# Patient Record
Sex: Male | Born: 1975 | Race: White | Hispanic: No | Marital: Single | Smoking: Current every day smoker
Health system: Southern US, Community
[De-identification: ages and names within clinical notes are randomized; demographics above are authoritative.]

## PROBLEM LIST (undated history)

## (undated) DIAGNOSIS — E785 Hyperlipidemia, unspecified: Secondary | ICD-10-CM

## (undated) DIAGNOSIS — I10 Essential (primary) hypertension: Secondary | ICD-10-CM

## (undated) DIAGNOSIS — N200 Calculus of kidney: Secondary | ICD-10-CM

## (undated) HISTORY — PX: JOINT REPLACEMENT: SHX530

## (undated) HISTORY — PX: ANKLE SURGERY: SHX546

---

## 2005-12-03 ENCOUNTER — Emergency Department: Payer: Self-pay | Admitting: Unknown Physician Specialty

## 2008-01-30 ENCOUNTER — Emergency Department: Payer: Self-pay | Admitting: Emergency Medicine

## 2008-08-11 ENCOUNTER — Emergency Department: Payer: Self-pay | Admitting: Emergency Medicine

## 2008-08-19 ENCOUNTER — Emergency Department: Payer: Self-pay | Admitting: Unknown Physician Specialty

## 2010-12-02 ENCOUNTER — Emergency Department: Payer: Self-pay | Admitting: Emergency Medicine

## 2013-07-28 ENCOUNTER — Emergency Department: Payer: Self-pay | Admitting: Emergency Medicine

## 2013-07-28 LAB — DIFFERENTIAL
BASOS ABS: 0 10*3/uL (ref 0.0–0.1)
Basophil %: 0.5 %
EOS ABS: 0.3 10*3/uL (ref 0.0–0.7)
EOS PCT: 4.3 %
LYMPHS PCT: 18.8 %
Lymphocyte #: 1.5 10*3/uL (ref 1.0–3.6)
Monocyte #: 0.9 x10 3/mm (ref 0.2–1.0)
Monocyte %: 11.1 %
Neutrophil #: 5.2 10*3/uL (ref 1.4–6.5)
Neutrophil %: 65.3 %

## 2013-07-28 LAB — HEPATIC FUNCTION PANEL A (ARMC)
ALBUMIN: 4 g/dL (ref 3.4–5.0)
Alkaline Phosphatase: 79 U/L
BILIRUBIN TOTAL: 0.3 mg/dL (ref 0.2–1.0)
Bilirubin, Direct: <0.1 mg/dL (ref 0.00–0.20)
SGOT(AST): 27 U/L (ref 15–37)
SGPT (ALT): 51 U/L (ref 12–78)
Total Protein: 7.7 g/dL (ref 6.4–8.2)

## 2013-07-28 LAB — URINALYSIS, COMPLETE
BILIRUBIN, UR: NEGATIVE
Blood: NEGATIVE
Glucose,UR: NEGATIVE mg/dL (ref 0–75)
Ketone: NEGATIVE
Leukocyte Esterase: NEGATIVE
NITRITE: NEGATIVE
PROTEIN: NEGATIVE
Ph: 6 (ref 4.5–8.0)
RBC,UR: 2 /HPF (ref 0–5)
SQUAMOUS EPITHELIAL: NONE SEEN
Specific Gravity: 1.015 (ref 1.003–1.030)

## 2013-07-28 LAB — CBC
HCT: 40.7 % (ref 40.0–52.0)
HGB: 13.8 g/dL (ref 13.0–18.0)
MCH: 31.1 pg (ref 26.0–34.0)
MCHC: 34 g/dL (ref 32.0–36.0)
MCV: 92 fL (ref 80–100)
Platelet: 267 10*3/uL (ref 150–440)
RBC: 4.45 10*6/uL (ref 4.40–5.90)
RDW: 13 % (ref 11.5–14.5)
WBC: 8 10*3/uL (ref 3.8–10.6)

## 2013-07-28 LAB — BASIC METABOLIC PANEL
Anion Gap: 3 — ABNORMAL LOW (ref 7–16)
BUN: 18 mg/dL (ref 7–18)
CHLORIDE: 105 mmol/L (ref 98–107)
CREATININE: 0.83 mg/dL (ref 0.60–1.30)
Calcium, Total: 8.8 mg/dL (ref 8.5–10.1)
Co2: 33 mmol/L — ABNORMAL HIGH (ref 21–32)
EGFR (African American): >60
EGFR (Non-African Amer.): >60
GLUCOSE: 100 mg/dL — AB (ref 65–99)
OSMOLALITY: 283 (ref 275–301)
POTASSIUM: 4 mmol/L (ref 3.5–5.1)
SODIUM: 141 mmol/L (ref 136–145)

## 2013-07-28 LAB — TROPONIN I: Troponin-I: <0.02 ng/mL

## 2013-07-28 LAB — SEDIMENTATION RATE: ERYTHROCYTE SED RATE: 3 mm/h (ref 0–15)

## 2015-08-10 ENCOUNTER — Emergency Department: Payer: Self-pay

## 2015-08-10 ENCOUNTER — Encounter: Payer: Self-pay | Admitting: Emergency Medicine

## 2015-08-10 ENCOUNTER — Emergency Department
Admission: EM | Admit: 2015-08-10 | Discharge: 2015-08-11 | Disposition: A | Discharge status: Home / Self Care | Payer: Self-pay | Attending: Emergency Medicine | Admitting: Emergency Medicine

## 2015-08-10 DIAGNOSIS — F17210 Nicotine dependence, cigarettes, uncomplicated: Secondary | ICD-10-CM | POA: Insufficient documentation

## 2015-08-10 DIAGNOSIS — R05 Cough: Secondary | ICD-10-CM | POA: Insufficient documentation

## 2015-08-10 DIAGNOSIS — Z5321 Procedure and treatment not carried out due to patient leaving prior to being seen by health care provider: Secondary | ICD-10-CM | POA: Insufficient documentation

## 2015-08-10 NOTE — ED Notes (Addendum)
Patient ambulatory to triage with steady gait, without difficulty or distress noted; pt reports x 3 days nonprod cough, chills, body aches

## 2016-01-26 ENCOUNTER — Emergency Department: Payer: Self-pay

## 2016-01-26 ENCOUNTER — Encounter: Payer: Self-pay | Admitting: Emergency Medicine

## 2016-01-26 ENCOUNTER — Emergency Department
Admission: EM | Admit: 2016-01-26 | Discharge: 2016-01-26 | Disposition: A | Discharge status: Home / Self Care | Payer: Self-pay | Attending: Emergency Medicine | Admitting: Emergency Medicine

## 2016-01-26 DIAGNOSIS — F17210 Nicotine dependence, cigarettes, uncomplicated: Secondary | ICD-10-CM | POA: Insufficient documentation

## 2016-01-26 DIAGNOSIS — N200 Calculus of kidney: Secondary | ICD-10-CM | POA: Insufficient documentation

## 2016-01-26 DIAGNOSIS — R109 Unspecified abdominal pain: Secondary | ICD-10-CM

## 2016-01-26 LAB — COMPREHENSIVE METABOLIC PANEL
ALK PHOS: 57 U/L (ref 38–126)
ALT: 43 U/L (ref 17–63)
ANION GAP: 9 (ref 5–15)
AST: 29 U/L (ref 15–41)
Albumin: 4.2 g/dL (ref 3.5–5.0)
BILIRUBIN TOTAL: 0.5 mg/dL (ref 0.3–1.2)
BUN: 16 mg/dL (ref 6–20)
CALCIUM: 9.1 mg/dL (ref 8.9–10.3)
CO2: 28 mmol/L (ref 22–32)
CREATININE: 0.94 mg/dL (ref 0.61–1.24)
Chloride: 106 mmol/L (ref 101–111)
Glucose, Bld: 96 mg/dL (ref 65–99)
Potassium: 4 mmol/L (ref 3.5–5.1)
SODIUM: 143 mmol/L (ref 135–145)
TOTAL PROTEIN: 7 g/dL (ref 6.5–8.1)

## 2016-01-26 LAB — URINALYSIS COMPLETE WITH MICROSCOPIC (ARMC ONLY)
BACTERIA UA: NONE SEEN
BILIRUBIN URINE: NEGATIVE
GLUCOSE, UA: NEGATIVE mg/dL
Ketones, ur: NEGATIVE mg/dL
LEUKOCYTES UA: NEGATIVE
NITRITE: NEGATIVE
Protein, ur: 30 mg/dL — AB
SPECIFIC GRAVITY, URINE: 1.016 (ref 1.005–1.030)
pH: 5 (ref 5.0–8.0)

## 2016-01-26 LAB — CBC WITH DIFFERENTIAL/PLATELET
Basophils Absolute: 0.1 10*3/uL (ref 0–0.1)
Basophils Relative: 1 %
EOS ABS: 0.4 10*3/uL (ref 0–0.7)
Eosinophils Relative: 3 %
HEMATOCRIT: 39.6 % — AB (ref 40.0–52.0)
HEMOGLOBIN: 13.8 g/dL (ref 13.0–18.0)
LYMPHS ABS: 1.4 10*3/uL (ref 1.0–3.6)
LYMPHS PCT: 11 %
MCH: 31.4 pg (ref 26.0–34.0)
MCHC: 34.9 g/dL (ref 32.0–36.0)
MCV: 89.8 fL (ref 80.0–100.0)
MONOS PCT: 10 %
Monocytes Absolute: 1.4 10*3/uL — ABNORMAL HIGH (ref 0.2–1.0)
NEUTROS PCT: 75 %
Neutro Abs: 10.1 10*3/uL — ABNORMAL HIGH (ref 1.4–6.5)
Platelets: 241 10*3/uL (ref 150–440)
RBC: 4.4 MIL/uL (ref 4.40–5.90)
RDW: 13 % (ref 11.5–14.5)
WBC: 13.4 10*3/uL — AB (ref 3.8–10.6)

## 2016-01-26 MED ORDER — MORPHINE SULFATE (PF) 2 MG/ML IV SOLN
4.0000 mg | Freq: Once | INTRAVENOUS | Status: AC
  Administered 2016-01-26: 4 mg via INTRAVENOUS
  Filled 2016-01-26: qty 2

## 2016-01-26 MED ORDER — SODIUM CHLORIDE 0.9 % IV BOLUS (SEPSIS)
1000.0000 mL | Freq: Once | INTRAVENOUS | Status: AC
  Administered 2016-01-26: 1000 mL via INTRAVENOUS
  Filled 2016-01-26: qty 1000

## 2016-01-26 MED ORDER — ONDANSETRON 4 MG PO TBDP: 4.0000 mg | ORAL_TABLET | Freq: Three times a day (TID) | ORAL | 0 refills | Status: DC | PRN

## 2016-01-26 MED ORDER — KETOROLAC TROMETHAMINE 30 MG/ML IJ SOLN
30.0000 mg | Freq: Once | INTRAMUSCULAR | Status: AC
  Administered 2016-01-26: 30 mg via INTRAVENOUS
  Filled 2016-01-26: qty 1

## 2016-01-26 MED ORDER — OXYCODONE-ACETAMINOPHEN 5-325 MG PO TABS: 1.0000 | ORAL_TABLET | Freq: Four times a day (QID) | ORAL | 0 refills | Status: DC | PRN

## 2016-01-26 MED ORDER — ONDANSETRON HCL 4 MG/2ML IJ SOLN
4.0000 mg | Freq: Once | INTRAMUSCULAR | Status: AC
  Administered 2016-01-26: 4 mg via INTRAVENOUS
  Filled 2016-01-26: qty 2

## 2016-01-26 NOTE — ED Triage Notes (Signed)
Reports left flank pain throughout the night, blood in urine this am.  Skin w/d

## 2016-01-26 NOTE — ED Provider Notes (Signed)
[facility] Emergency Department Provider Note  Time seen: 9:27 AM  I have reviewed the triage vital signs and the nursing notes.   HISTORY  Chief Complaint Flank Pain    HPI Timothy Mcintyre. is a 40 y.o. male with no past medical history who presents to the emergency department with left flank pain. According to the patient around 2:00 this morning he awoke with severe left flank pain. States he urinated and he looked very dark like blood. Denies any history of kidney stones in the past. States he took 2 Percocet this morning that he had left over from a prior surgery, with minimal relief. Currently states 8/10 pain. Denies any fever. Denies any dysuria.  History reviewed. No pertinent past medical history.  There are no active problems to display for this patient.   Past Surgical History:  Procedure Laterality Date  . ANKLE SURGERY      Prior to Admission medications   Not on File    No Known Allergies  No family history on file.  Social History Social History  Substance Use Topics  . Smoking status: Current Every Day Smoker    Packs/day: 0.50    Types: Cigarettes  . Smokeless tobacco: Never Used  . Alcohol use No    Review of Systems Constitutional: Negative for fever. Cardiovascular: Negative for chest pain. Respiratory: Negative for shortness of breath. Gastrointestinal: Left flank pain. Neurological: Negative for headache 10-point ROS otherwise negative.  ____________________________________________   PHYSICAL EXAM:  VITAL SIGNS: ED Triage Vitals [01/26/16 0903]  Enc Vitals Group     BP (!) 149/97     Pulse Rate 71     Resp 18     Temp 98 F (36.7 C)     Temp Source Oral     SpO2 99 %     Weight 240 lb (108.9 kg)     Height 5\' 11"  (1.803 m)     Head Circumference      Peak Flow      Pain Score 9     Pain Loc      Pain Edu?      Excl. in GC?     Constitutional: Alert and oriented. Well appearing and in no  distress. Eyes: Normal exam ENT   Head: Normocephalic and atraumatic.   Mouth/Throat: Mucous membranes are moist. Cardiovascular: Normal rate, regular rhythm. No murmur Respiratory: Normal respiratory effort without tachypnea nor retractions. Breath sounds are clear Gastrointestinal: Soft and nontender. No distention.  No CVA tenderness. Musculoskeletal: Nontender with normal range of motion in all extremities.  Neurologic:  Normal speech and language. No gross focal neurologic deficits  Skin:  Skin is warm, dry and intact.  Psychiatric: Mood and affect are normal.   ____________________________________________     RADIOLOGY  CT shows mild hydronephrosis, likely secondary to recently passed stone.  ____________________________________________   INITIAL IMPRESSION / ASSESSMENT AND PLAN / ED COURSE  Pertinent labs & imaging results that were available during my care of the patient were reviewed by me and considered in my medical decision making (see chart for details).  The patient presents the emergency department with sudden onset of left flank pain around 2:00 this morning. States one episode of hematuria this morning as well. No history of kidney stones in the past. We'll check labs, urinalysis, and a CT renal scan, does Toradol/morphine/Zofran as well as IV fluids while awaiting results.  CT most consistent with recently passed stone. Patient states his  pain is much improved. Urinalysis consistent with too numerous to count RBCs, no WBC or bacteria. Overall workup most consistent with likely recently passed ureteral stone. Discussed twelve-month follow-up for pulmonary nodules.  ____________________________________________   FINAL CLINICAL IMPRESSION(S) / ED DIAGNOSES  Left flank pain Kidney stone   Minna AntisKevin Erick Oxendine, MD 01/26/16 1134

## 2016-01-27 LAB — URINE CULTURE

## 2016-11-23 ENCOUNTER — Emergency Department
Admission: EM | Admit: 2016-11-23 | Discharge: 2016-11-23 | Disposition: A | Discharge status: Home / Self Care | Payer: Self-pay | Attending: Student in an Organized Health Care Education/Training Program | Admitting: Student in an Organized Health Care Education/Training Program

## 2016-11-23 ENCOUNTER — Emergency Department: Payer: Self-pay

## 2016-11-23 DIAGNOSIS — N201 Calculus of ureter: Secondary | ICD-10-CM

## 2016-11-23 DIAGNOSIS — N202 Calculus of kidney with calculus of ureter: Secondary | ICD-10-CM | POA: Insufficient documentation

## 2016-11-23 DIAGNOSIS — F17210 Nicotine dependence, cigarettes, uncomplicated: Secondary | ICD-10-CM | POA: Insufficient documentation

## 2016-11-23 DIAGNOSIS — R109 Unspecified abdominal pain: Secondary | ICD-10-CM

## 2016-11-23 LAB — CBC
HCT: 45.5 % (ref 40.0–52.0)
HEMOGLOBIN: 15.2 g/dL (ref 13.0–18.0)
MCH: 30.3 pg (ref 26.0–34.0)
MCHC: 33.4 g/dL (ref 32.0–36.0)
MCV: 90.8 fL (ref 80.0–100.0)
Platelets: 266 10*3/uL (ref 150–440)
RBC: 5.01 MIL/uL (ref 4.40–5.90)
RDW: 13.3 % (ref 11.5–14.5)
WBC: 13.3 10*3/uL — AB (ref 3.8–10.6)

## 2016-11-23 LAB — BASIC METABOLIC PANEL
ANION GAP: 9 (ref 5–15)
BUN: 15 mg/dL (ref 6–20)
CALCIUM: 10 mg/dL (ref 8.9–10.3)
CHLORIDE: 103 mmol/L (ref 101–111)
CO2: 28 mmol/L (ref 22–32)
Creatinine, Ser: 0.92 mg/dL (ref 0.61–1.24)
GFR calc non Af Amer: >60 mL/min (ref >60)
Glucose, Bld: 108 mg/dL — ABNORMAL HIGH (ref 65–99)
Potassium: 4.1 mmol/L (ref 3.5–5.1)
SODIUM: 140 mmol/L (ref 135–145)

## 2016-11-23 LAB — URINALYSIS, COMPLETE (UACMP) WITH MICROSCOPIC
Bacteria, UA: NONE SEEN
SPECIFIC GRAVITY, URINE: 1.027 (ref 1.005–1.030)
Squamous Epithelial / LPF: NONE SEEN

## 2016-11-23 MED ORDER — SODIUM CHLORIDE 0.9 % IV BOLUS (SEPSIS)
1000.0000 mL | Freq: Once | INTRAVENOUS | Status: AC
  Administered 2016-11-23: 1000 mL via INTRAVENOUS

## 2016-11-23 MED ORDER — PROMETHAZINE HCL 12.5 MG PO TABS: 12.5000 mg | ORAL_TABLET | Freq: Four times a day (QID) | ORAL | 0 refills | Status: DC | PRN

## 2016-11-23 MED ORDER — KETOROLAC TROMETHAMINE 30 MG/ML IJ SOLN
INTRAMUSCULAR | Status: AC
  Filled 2016-11-23: qty 1

## 2016-11-23 MED ORDER — KETOROLAC TROMETHAMINE 30 MG/ML IJ SOLN
15.0000 mg | Freq: Once | INTRAMUSCULAR | Status: AC
  Administered 2016-11-23: 15 mg via INTRAVENOUS

## 2016-11-23 MED ORDER — FENTANYL CITRATE (PF) 100 MCG/2ML IJ SOLN
INTRAMUSCULAR | Status: AC
  Filled 2016-11-23: qty 2

## 2016-11-23 MED ORDER — MORPHINE SULFATE (PF) 4 MG/ML IV SOLN
4.0000 mg | INTRAVENOUS | Status: DC | PRN
  Administered 2016-11-23 (×2): 4 mg via INTRAVENOUS
  Filled 2016-11-23 (×2): qty 1

## 2016-11-23 MED ORDER — OXYCODONE-ACETAMINOPHEN 7.5-325 MG PO TABS: 1.0000 | ORAL_TABLET | ORAL | 0 refills | Status: DC | PRN

## 2016-11-23 MED ORDER — PROMETHAZINE HCL 25 MG/ML IJ SOLN
12.5000 mg | Freq: Four times a day (QID) | INTRAMUSCULAR | Status: DC | PRN
  Administered 2016-11-23: 12.5 mg via INTRAVENOUS
  Filled 2016-11-23: qty 1

## 2016-11-23 MED ORDER — TAMSULOSIN HCL 0.4 MG PO CAPS
ORAL_CAPSULE | ORAL | Status: AC
  Filled 2016-11-23: qty 1

## 2016-11-23 MED ORDER — FENTANYL CITRATE (PF) 100 MCG/2ML IJ SOLN
100.0000 ug | Freq: Once | INTRAMUSCULAR | Status: AC
  Administered 2016-11-23: 100 ug via INTRAVENOUS

## 2016-11-23 MED ORDER — TAMSULOSIN HCL 0.4 MG PO CAPS
0.4000 mg | ORAL_CAPSULE | Freq: Every day | ORAL | Status: DC
  Administered 2016-11-23: 0.4 mg via ORAL
  Filled 2016-11-23: qty 1

## 2016-11-23 MED ORDER — OXYCODONE-ACETAMINOPHEN 5-325 MG PO TABS
1.0000 | ORAL_TABLET | ORAL | Status: DC | PRN
  Administered 2016-11-23: 1 via ORAL
  Filled 2016-11-23 (×4): qty 1

## 2016-11-23 MED ORDER — OXYCODONE-ACETAMINOPHEN 5-325 MG PO TABS
2.0000 | ORAL_TABLET | Freq: Once | ORAL | Status: AC
  Administered 2016-11-23: 2 via ORAL

## 2016-11-23 MED ORDER — TAMSULOSIN HCL 0.4 MG PO CAPS: 0.4000 mg | ORAL_CAPSULE | Freq: Every day | ORAL | 0 refills | Status: AC

## 2016-11-23 NOTE — ED Notes (Signed)
Pt. Going home with family. 

## 2016-11-23 NOTE — ED Notes (Signed)
Patient transported to CT 

## 2016-11-23 NOTE — ED Notes (Signed)
First nurse note   Presents with left flank pain  states he noticed some blood in urine couple of days ago

## 2016-11-23 NOTE — ED Triage Notes (Signed)
Patient presents today with flank pain X3 days. HX kidney stones. Has noticed some blood in urine. Ambulatory in triage

## 2016-11-23 NOTE — ED Provider Notes (Addendum)
[facility] Emergency Department Provider Note    First MD Initiated Contact with Patient 11/23/16 1606     (approximate)  I have reviewed the triage vital signs and the nursing notes.   HISTORY  Chief Complaint Flank Pain    HPI Timothy Mcintyre. is a 41 y.o. male presents with 2 days of flank pain and hematuria. States the pain was initially bilateral but over the past 24 hours has been primarily on the left side. States is an achy throbbing pain without migration. States that she he has had chills but no measured fevers. Has had nausea secondary to pain but no vomiting. States it feels similar to this time he was seen for similar symptoms and was diagnosed with a kidney stone. He denies any dysuria. No abdominal pain. No chest pain or shortness of breath.   No past medical history on file. No family history on file. Past Surgical History:  Procedure Laterality Date  . ANKLE SURGERY     There are no active problems to display for this patient.     Prior to Admission medications   Medication Sig Start Date End Date Taking? Authorizing Provider  ondansetron (ZOFRAN ODT) 4 MG disintegrating tablet Take 1 tablet (4 mg total) by mouth every 8 (eight) hours as needed for nausea or vomiting. 01/26/16   Minna Antis, MD  oxyCODONE-acetaminophen (PERCOCET) 7.5-325 MG tablet Take 1 tablet by mouth every 4 (four) hours as needed for severe pain. 11/23/16 11/23/17  Willy Eddy, MD  oxyCODONE-acetaminophen (ROXICET) 5-325 MG tablet Take 1 tablet by mouth every 6 (six) hours as needed. 01/26/16   Minna Antis, MD  promethazine (PHENERGAN) 12.5 MG tablet Take 1 tablet (12.5 mg total) by mouth every 6 (six) hours as needed for nausea or vomiting. 11/23/16   Willy Eddy, MD  tamsulosin (FLOMAX) 0.4 MG CAPS capsule Take 1 capsule (0.4 mg total) by mouth at bedtime. 11/23/16   Willy Eddy, MD    Allergies Patient has no known  allergies.    Social History Social History  Substance Use Topics  . Smoking status: Current Every Day Smoker    Packs/day: 0.50    Types: Cigarettes  . Smokeless tobacco: Never Used  . Alcohol use No    Review of Systems Patient denies headaches, rhinorrhea, blurry vision, numbness, shortness of breath, chest pain, edema, cough, abdominal pain, nausea, vomiting, diarrhea, dysuria, fevers, rashes or hallucinations unless otherwise stated above in HPI. ____________________________________________   PHYSICAL EXAM:  VITAL SIGNS: Vitals:   11/23/16 1539  BP: (!) 131/100  Pulse: 83  Resp: 20  Temp: 98.3 F (36.8 C)  SpO2: 97%    Constitutional: Alert and oriented. Well appearing and in no acute distress. Eyes: Conjunctivae are normal.  Head: Atraumatic. Nose: No congestion/rhinnorhea. Mouth/Throat: Mucous membranes are moist.   Neck: No stridor. Painless ROM.  Cardiovascular: Normal rate, regular rhythm. Grossly normal heart sounds.  Good peripheral circulation. Respiratory: Normal respiratory effort.  No retractions. Lungs CTAB. Gastrointestinal: Soft and nontender. No distention. No abdominal bruits. +left CVA tenderness. Genitourinary:  Musculoskeletal: No lower extremity tenderness nor edema.  No joint effusions. Neurologic:  Normal speech and language. No gross focal neurologic deficits are appreciated. No facial droop Skin:  Skin is warm, dry and intact. No rash noted. Psychiatric: Mood and affect are normal. Speech and behavior are normal.  ____________________________________________   LABS (all labs ordered are listed, but only abnormal results are displayed)  Results for orders placed  or performed during the hospital encounter of 11/23/16 (from the past 24 hour(s))  Urinalysis, Complete w Microscopic     Status: Abnormal   Collection Time: 11/23/16  3:41 PM  Result Value Ref Range   Color, Urine AMBER (A) YELLOW   APPearance CLOUDY (A) CLEAR   Specific  Gravity, Urine 1.027 1.005 - 1.030   pH  5.0 - 8.0    TEST NOT REPORTED DUE TO COLOR INTERFERENCE OF URINE PIGMENT   Glucose, UA (A) NEGATIVE mg/dL    TEST NOT REPORTED DUE TO COLOR INTERFERENCE OF URINE PIGMENT   Hgb urine dipstick (A) NEGATIVE    TEST NOT REPORTED DUE TO COLOR INTERFERENCE OF URINE PIGMENT   Bilirubin Urine (A) NEGATIVE    TEST NOT REPORTED DUE TO COLOR INTERFERENCE OF URINE PIGMENT   Ketones, ur (A) NEGATIVE mg/dL    TEST NOT REPORTED DUE TO COLOR INTERFERENCE OF URINE PIGMENT   Protein, ur (A) NEGATIVE mg/dL    TEST NOT REPORTED DUE TO COLOR INTERFERENCE OF URINE PIGMENT   Nitrite (A) NEGATIVE    TEST NOT REPORTED DUE TO COLOR INTERFERENCE OF URINE PIGMENT   Leukocytes, UA (A) NEGATIVE    TEST NOT REPORTED DUE TO COLOR INTERFERENCE OF URINE PIGMENT   RBC / HPF TOO NUMEROUS TO COUNT 0 - 5 RBC/hpf   WBC, UA 6-30 0 - 5 WBC/hpf   Bacteria, UA NONE SEEN NONE SEEN   Squamous Epithelial / LPF NONE SEEN NONE SEEN   Mucus PRESENT    Ca Oxalate Crys, UA PRESENT   Basic metabolic panel     Status: Abnormal   Collection Time: 11/23/16  3:41 PM  Result Value Ref Range   Sodium 140 135 - 145 mmol/L   Potassium 4.1 3.5 - 5.1 mmol/L   Chloride 103 101 - 111 mmol/L   CO2 28 22 - 32 mmol/L   Glucose, Bld 108 (H) 65 - 99 mg/dL   BUN 15 6 - 20 mg/dL   Creatinine, Ser 5.36 0.61 - 1.24 mg/dL   Calcium 64.4 8.9 - 03.4 mg/dL   GFR calc non Af Amer >60 >60 mL/min   GFR calc Af Amer >60 >60 mL/min   Anion gap 9 5 - 15  CBC     Status: Abnormal   Collection Time: 11/23/16  3:41 PM  Result Value Ref Range   WBC 13.3 (H) 3.8 - 10.6 K/uL   RBC 5.01 4.40 - 5.90 MIL/uL   Hemoglobin 15.2 13.0 - 18.0 g/dL   HCT 74.2 59.5 - 63.8 %   MCV 90.8 80.0 - 100.0 fL   MCH 30.3 26.0 - 34.0 pg   MCHC 33.4 32.0 - 36.0 g/dL   RDW 75.6 43.3 - 29.5 %   Platelets 266 150 - 440 K/uL   ____________________________________________  ____________________________________  RADIOLOGY  I personally  reviewed all radiographic images ordered to evaluate for the above acute complaints and reviewed radiology reports and findings.  These findings were personally discussed with the patient.  Please see medical record for radiology report.  ____________________________________________   PROCEDURES  Procedure(s) performed:  Procedures    Critical Care performed: no ____________________________________________   INITIAL IMPRESSION / ASSESSMENT AND PLAN / ED COURSE  Pertinent labs & imaging results that were available during my care of the patient were reviewed by me and considered in my medical decision making (see chart for details).  DDX: stone, pyelo, cystitis, hemorrhage, AAA, dissection  Timothy Darryl Nestle. is a  41 y.o. who presents to the ED with  Hx of stones p/w acute right and left flank pain. No fevers, no systemic symptoms. + urinary symptoms. Denies trauma or injury. Afebrile in ED. Exam as above. Flank TTP, otherwise abdominal exam is benign. No peritoneal signs. Possible kidney stone, cystitis, or pyelonephritis.   Checking urine. UA with gross hematuria but no pyuria or bacteria CT Stone with with acute 4mm right ureteral stone with hydro.  Bilateral stones.   Clinical picture is not consistent with appendicitis, diverticulitis, pancreatitis, cholecystitis, bowel perforation, aortic dissection, splenic injury or acute abdominal process at this time.  ----------------------------------------- 7:09 PM on 11/23/2016 -----------------------------------------   Pain improved, tolerating PO. Repeat ABD exam benign, will plan supportive treatment and early follow up for recheck.       ____________________________________________   FINAL CLINICAL IMPRESSION(S) / ED DIAGNOSES  Final diagnoses:  Ureterolithiasis  Flank pain      NEW MEDICATIONS STARTED DURING THIS VISIT:  New Prescriptions   OXYCODONE-ACETAMINOPHEN (PERCOCET) 7.5-325 MG TABLET    Take 1 tablet  by mouth every 4 (four) hours as needed for severe pain.   PROMETHAZINE (PHENERGAN) 12.5 MG TABLET    Take 1 tablet (12.5 mg total) by mouth every 6 (six) hours as needed for nausea or vomiting.   TAMSULOSIN (FLOMAX) 0.4 MG CAPS CAPSULE    Take 1 capsule (0.4 mg total) by mouth at bedtime.     Note:  This document was prepared using Dragon voice recognition software and may include unintentional dictation errors.    Willy Eddy, MD 11/23/16 Izell Burt    Willy Eddy, MD 11/23/16 Izell Glasgow

## 2017-01-30 ENCOUNTER — Emergency Department: Payer: Self-pay

## 2017-01-30 ENCOUNTER — Encounter: Payer: Self-pay | Admitting: Emergency Medicine

## 2017-01-30 ENCOUNTER — Emergency Department
Admission: EM | Admit: 2017-01-30 | Discharge: 2017-01-30 | Disposition: A | Discharge status: Home / Self Care | Payer: Self-pay | Attending: Emergency Medicine | Admitting: Emergency Medicine

## 2017-01-30 DIAGNOSIS — M545 Low back pain, unspecified: Secondary | ICD-10-CM

## 2017-01-30 DIAGNOSIS — I10 Essential (primary) hypertension: Secondary | ICD-10-CM | POA: Insufficient documentation

## 2017-01-30 DIAGNOSIS — F17210 Nicotine dependence, cigarettes, uncomplicated: Secondary | ICD-10-CM | POA: Insufficient documentation

## 2017-01-30 DIAGNOSIS — M5412 Radiculopathy, cervical region: Secondary | ICD-10-CM | POA: Insufficient documentation

## 2017-01-30 DIAGNOSIS — Z96661 Presence of right artificial ankle joint: Secondary | ICD-10-CM | POA: Insufficient documentation

## 2017-01-30 DIAGNOSIS — Z79899 Other long term (current) drug therapy: Secondary | ICD-10-CM | POA: Insufficient documentation

## 2017-01-30 HISTORY — DX: Essential (primary) hypertension: I10

## 2017-01-30 HISTORY — DX: Hyperlipidemia, unspecified: E78.5

## 2017-01-30 LAB — URINALYSIS, COMPLETE (UACMP) WITH MICROSCOPIC
BILIRUBIN URINE: NEGATIVE
Bacteria, UA: NONE SEEN
GLUCOSE, UA: NEGATIVE mg/dL
Hgb urine dipstick: NEGATIVE
KETONES UR: NEGATIVE mg/dL
LEUKOCYTES UA: NEGATIVE
NITRITE: NEGATIVE
PH: 7 (ref 5.0–8.0)
Protein, ur: 30 mg/dL — AB
RBC / HPF: NONE SEEN RBC/hpf (ref 0–5)
Specific Gravity, Urine: 1.018 (ref 1.005–1.030)
Squamous Epithelial / LPF: NONE SEEN
WBC, UA: NONE SEEN WBC/hpf (ref 0–5)

## 2017-01-30 LAB — BASIC METABOLIC PANEL
ANION GAP: 10 (ref 5–15)
BUN: 11 mg/dL (ref 6–20)
CHLORIDE: 100 mmol/L — AB (ref 101–111)
CO2: 28 mmol/L (ref 22–32)
Calcium: 9.8 mg/dL (ref 8.9–10.3)
Creatinine, Ser: 0.76 mg/dL (ref 0.61–1.24)
GFR calc Af Amer: >60 mL/min (ref >60)
GFR calc non Af Amer: >60 mL/min (ref >60)
Glucose, Bld: 99 mg/dL (ref 65–99)
POTASSIUM: 4.1 mmol/L (ref 3.5–5.1)
Sodium: 138 mmol/L (ref 135–145)

## 2017-01-30 LAB — CBC
HEMATOCRIT: 42.7 % (ref 40.0–52.0)
Hemoglobin: 14.4 g/dL (ref 13.0–18.0)
MCH: 30.5 pg (ref 26.0–34.0)
MCHC: 33.6 g/dL (ref 32.0–36.0)
MCV: 90.7 fL (ref 80.0–100.0)
Platelets: 276 10*3/uL (ref 150–440)
RBC: 4.71 MIL/uL (ref 4.40–5.90)
RDW: 13.5 % (ref 11.5–14.5)
WBC: 12.6 10*3/uL — AB (ref 3.8–10.6)

## 2017-01-30 LAB — TROPONIN I: Troponin I: <0.03 ng/mL (ref <0.03)

## 2017-01-30 MED ORDER — SODIUM CHLORIDE 0.9 % IV BOLUS (SEPSIS)
1000.0000 mL | Freq: Once | INTRAVENOUS | Status: AC
  Administered 2017-01-30: 1000 mL via INTRAVENOUS

## 2017-01-30 MED ORDER — LORAZEPAM 2 MG/ML IJ SOLN
1.0000 mg | Freq: Once | INTRAMUSCULAR | Status: AC
  Administered 2017-01-30: 1 mg via INTRAVENOUS
  Filled 2017-01-30: qty 0.5

## 2017-01-30 MED ORDER — LORAZEPAM 2 MG/ML IJ SOLN
INTRAMUSCULAR | Status: AC
  Administered 2017-01-30: 1 mg via INTRAVENOUS
  Filled 2017-01-30: qty 1

## 2017-01-30 MED ORDER — OXYCODONE-ACETAMINOPHEN 5-325 MG PO TABS
2.0000 | ORAL_TABLET | Freq: Once | ORAL | Status: AC
  Administered 2017-01-30: 2 via ORAL
  Filled 2017-01-30: qty 2

## 2017-01-30 MED ORDER — GABAPENTIN 100 MG PO CAPS: 300.0000 mg | ORAL_CAPSULE | Freq: Three times a day (TID) | ORAL | 0 refills | Status: DC

## 2017-01-30 MED ORDER — OXYCODONE-ACETAMINOPHEN 5-325 MG PO TABS: 1.0000 | ORAL_TABLET | Freq: Four times a day (QID) | ORAL | 0 refills | Status: DC | PRN

## 2017-01-30 MED ORDER — FENTANYL CITRATE (PF) 100 MCG/2ML IJ SOLN
50.0000 ug | Freq: Once | INTRAMUSCULAR | Status: AC
  Administered 2017-01-30: 50 ug via INTRAVENOUS
  Filled 2017-01-30: qty 2

## 2017-01-30 MED ORDER — METHYLPREDNISOLONE 4 MG PO TBPK: ORAL_TABLET | ORAL | 0 refills | Status: DC

## 2017-01-30 NOTE — ED Notes (Signed)
Patient to ed with c/o back pain x3 days as well as intermittent chest pain and left arm numbness. Reports shortness of breath and weakness x2 days. Patient reports movement of left arm results in increased numbness and shooting pain in back. Reports increased strenuous use of arms and legs at work over the weekend. Pt ambulates with ease, skin warm and dry.  Denies pain at this time.  Lungs clear bilat, heart sounds normal.  Pt alert and oriented.

## 2017-01-30 NOTE — ED Triage Notes (Signed)
Patient complaining of back pain x3 days as well as intermittent chest pain and left arm numbness. Patient reports shortness of breath and weakness x2 days. Patient states that any use of left arm results in numbness and shooting pain in back. Patient denies cardiac history. Reports increased strenuous use of arms and legs at work over the weekend.
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
## 2017-01-30 NOTE — ED Notes (Signed)

## 2017-01-30 NOTE — ED Notes (Signed)
Patient transported to MRI 

## 2017-02-10 ENCOUNTER — Emergency Department
Admission: EM | Admit: 2017-02-10 | Discharge: 2017-02-10 | Disposition: A | Discharge status: Home / Self Care | Payer: Self-pay | Attending: Emergency Medicine | Admitting: Emergency Medicine

## 2017-02-10 ENCOUNTER — Encounter: Payer: Self-pay | Admitting: Emergency Medicine

## 2017-02-10 DIAGNOSIS — M5412 Radiculopathy, cervical region: Secondary | ICD-10-CM | POA: Insufficient documentation

## 2017-02-10 DIAGNOSIS — M5020 Other cervical disc displacement, unspecified cervical region: Secondary | ICD-10-CM | POA: Insufficient documentation

## 2017-02-10 DIAGNOSIS — Z79899 Other long term (current) drug therapy: Secondary | ICD-10-CM | POA: Insufficient documentation

## 2017-02-10 DIAGNOSIS — F17210 Nicotine dependence, cigarettes, uncomplicated: Secondary | ICD-10-CM | POA: Insufficient documentation

## 2017-02-10 DIAGNOSIS — Z96661 Presence of right artificial ankle joint: Secondary | ICD-10-CM | POA: Insufficient documentation

## 2017-02-10 MED ORDER — ONDANSETRON 4 MG PO TBDP: 4.0000 mg | ORAL_TABLET | Freq: Three times a day (TID) | ORAL | 0 refills | Status: DC | PRN

## 2017-02-10 MED ORDER — ONDANSETRON 4 MG PO TBDP
4.0000 mg | ORAL_TABLET | Freq: Once | ORAL | Status: AC
  Administered 2017-02-10: 4 mg via ORAL
  Filled 2017-02-10: qty 1

## 2017-02-10 MED ORDER — GABAPENTIN 600 MG PO TABS
ORAL_TABLET | ORAL | Status: AC
Start: 2017-02-10 — End: 2017-02-10
  Administered 2017-02-10: 300 mg
  Filled 2017-02-10: qty 1

## 2017-02-10 MED ORDER — GABAPENTIN 300 MG PO CAPS
300.0000 mg | ORAL_CAPSULE | Freq: Once | ORAL | Status: DC
  Filled 2017-02-10: qty 1

## 2017-02-10 MED ORDER — GABAPENTIN 300 MG PO CAPS: 900.0000 mg | ORAL_CAPSULE | Freq: Three times a day (TID) | ORAL | 0 refills | Status: AC

## 2017-02-10 MED ORDER — KETOROLAC TROMETHAMINE 60 MG/2ML IM SOLN
30.0000 mg | Freq: Once | INTRAMUSCULAR | Status: AC
  Administered 2017-02-10: 30 mg via INTRAMUSCULAR
  Filled 2017-02-10: qty 2

## 2017-02-10 MED ORDER — OXYCODONE-ACETAMINOPHEN 5-325 MG PO TABS: 1.0000 | ORAL_TABLET | Freq: Three times a day (TID) | ORAL | 0 refills | Status: DC | PRN

## 2017-02-10 MED ORDER — ORPHENADRINE CITRATE 30 MG/ML IJ SOLN
60.0000 mg | INTRAMUSCULAR | Status: AC
  Administered 2017-02-10: 60 mg via INTRAMUSCULAR
  Filled 2017-02-10: qty 2

## 2017-02-10 MED ORDER — CYCLOBENZAPRINE HCL 10 MG PO TABS: 10.0000 mg | ORAL_TABLET | Freq: Three times a day (TID) | ORAL | 0 refills | Status: DC | PRN

## 2017-02-10 MED ORDER — PREDNISONE 20 MG PO TABS
60.0000 mg | ORAL_TABLET | Freq: Once | ORAL | Status: AC
  Administered 2017-02-10: 60 mg via ORAL
  Filled 2017-02-10: qty 3

## 2017-02-10 MED ORDER — PREDNISONE 10 MG (21) PO TBPK: ORAL_TABLET | ORAL | 0 refills | Status: DC

## 2017-02-10 NOTE — Discharge Instructions (Signed)
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 Take (all of) the prescription meds as directed. Follow-up with [facility] for further evaluation. [facility] with [facility] for 02/24/17. Return to the ED for weakness, severe pain, or chest pain.

## 2017-02-10 NOTE — ED Notes (Signed)

## 2017-02-10 NOTE — ED Triage Notes (Signed)
Patient presents to the ED with severe neck pain x 2 weeks.  Patient states he has an appointment with a neurosurgeon but it is not until the 28th.  Patient states he is out of his pain medication and was told to come back to the ED because he does not have a PCP.  Patient states he is trying to establish care with a PCP.  Patient is ambulatory to triage, states, "it's like somebody took a chainsaw to my neck."  Patient denies any known injury to neck.

## 2017-02-10 NOTE — ED Provider Notes (Signed)
[facility] Emergency Department Provider Note ____________________________________________  Time seen: 1715  I have reviewed the triage vital signs and the nursing notes.  HISTORY  Chief Complaint  Neck Pain  HPI Timothy ButtRichard L Cephus Jr. is a 41 y.o. male presents to the ED, accompanied by family member, for evaluation of continued left-sided neck pain.  Patient reports a confirmed herniated disc in the cervical spine based on an MRI done 2 weeks prior here in the ED.  He was evaluated here for his neck pain and left upper semi-radicular symptoms.  The treating physician consulted with [facility], who concurred with the treatment plan which included gabapentin, Medrol Dosepak, and oxycodone.  The patient admittedly did not fill the prescription for gabapentin, because he reports a remote history of nausea related to same medication prescribed several years earlier for an unrelated knee injury.  As such he notes that the steroid also did not get feel because it was on the same sheet as the gabapentin.  He does admit to filling the prescription for the oxycodone and dosing that medicine over the next 2 days as prescribed.  He was subsequently referred to see Dr. Teola BradleyBarr in neurology 2 days following his ED visit.  The patient reports that he apparently did not read his discharge paperwork and apparently did not recognize that he was scheduled to see the neurologist 2 days following.  He instead presented [facility] to [facility], where he was again evaluated for cervical radiculopathy.  Once there he was discharged with prescriptions for Flexeril and oxycodone, he describes dosing of medications until the prescription ran out.  He was subsequently referred from that emergency department to see an orthopedic specialist within [facility].  He reports that appointment is not until the 28th of this month.  He is presenting today with continued neck pain and radicular symptoms  without any interim injury.  He denies any chest pain, shortness of breath, or syncope.  He reports pain in the left greater than right upper extremities as well as some chronic low back pain.  Past Medical History:  Diagnosis Date  . Hyperlipidemia   . Hypertension     There are no active problems to display for this patient.   Past Surgical History:  Procedure Laterality Date  . ANKLE SURGERY    . JOINT REPLACEMENT Right    right ankle    Prior to Admission medications   Medication Sig Start Date End Date Taking? Authorizing Provider  cyclobenzaprine (FLEXERIL) 10 MG tablet Take 1 tablet (10 mg total) 3 (three) times daily as needed by mouth for muscle spasms. 02/10/17   Montrel Donahoe, Timothy IvoryJenise V Bacon, PA-C  gabapentin (NEURONTIN) 100 MG capsule Take 3 capsules (300 mg total) by mouth 3 (three) times daily. 01/30/17 01/30/18  Jeanmarie PlantMcShane, James A, MD  gabapentin (NEURONTIN) 300 MG capsule Take 3 capsules (900 mg total) 3 (three) times daily by mouth. 02/10/17 03/12/17  Kaevon Cotta, Timothy IvoryJenise V Bacon, PA-C  methylPREDNISolone (MEDROL DOSEPAK) 4 MG TBPK tablet Day 1: 8 mg po bid before breakfst and hs, & 4 mg po bid after lunch and dinner; day 2: 4 mg po tid and 8 mg qhs;, day 3: 4 mg po qid; day 5:  4 mg po bid;, day 6: 4 mg po q am 01/30/17   Jeanmarie PlantMcShane, James A, MD  ondansetron (ZOFRAN ODT) 4 MG disintegrating tablet Take 1 tablet (4 mg total) every 8 (eight) hours as needed by mouth. 02/10/17  Shawnelle Spoerl, Timothy IvoryJenise V Bacon, PA-C  oxyCODONE-acetaminophen (ROXICET) 5-325 MG tablet Take 1 tablet every 8 (eight) hours as needed by mouth for moderate pain or severe pain. 02/10/17   Nahiara Kretzschmar, Timothy IvoryJenise V Bacon, PA-C  predniSONE (STERAPRED UNI-PAK 21 TAB) 10 MG (21) TBPK tablet 6-day taper as directed. 02/10/17   Kalyb Pemble, Timothy IvoryJenise V Bacon, PA-C  promethazine (PHENERGAN) 12.5 MG tablet Take 1 tablet (12.5 mg total) by mouth every 6 (six) hours as needed for nausea or vomiting. 11/23/16   [name], Patrick, MD  tamsulosin (FLOMAX)  0.4 MG CAPS capsule Take 1 capsule (0.4 mg total) by mouth at bedtime. 11/23/16   [name], Patrick, MD    Allergies Patient has no known allergies.  No family history on file.  Social History Social History   Tobacco Use  . Smoking status: Current Every Day Smoker    Packs/day: 0.50    Types: Cigarettes  . Smokeless tobacco: Never Used  Substance Use Topics  . Alcohol use: No  . Drug use: Not on file    Review of Systems  Constitutional: Negative for fever. Cardiovascular: Negative for chest pain. Respiratory: Negative for shortness of breath. Gastrointestinal: Negative for abdominal pain, vomiting and diarrhea. Genitourinary: Negative for dysuria. Musculoskeletal: Positive for back pain.  Reports cervical radicular symptoms on the left as above. Skin: Negative for rash. Neurological: Negative for headaches, focal weakness or numbness. ____________________________________________  PHYSICAL EXAM:  VITAL SIGNS: ED Triage Vitals  Enc Vitals Group     BP 02/10/17 1558 (!) 173/106     Pulse Rate 02/10/17 1558 100     Resp --      Temp 02/10/17 1558 99.1 F (37.3 C)     Temp Source 02/10/17 1558 Oral     SpO2 02/10/17 1558 99 %     Weight 02/10/17 1559 230 lb (104.3 kg)     Height 02/10/17 1559 5\' 11"  (1.803 m)     Head Circumference --      Peak Flow --      Pain Score 02/10/17 1557 10     Pain Loc --      Pain Edu? --      Excl. in GC? --     Constitutional: Alert and oriented. Well appearing and in no distress. Head: Normocephalic and atraumatic. Neck: Supple. No thyromegaly. Cardiovascular: Normal rate, regular rhythm. Normal distal pulses. Respiratory: Normal respiratory effort. No wheezes/rales/rhonchi. Musculoskeletal: Nontender with normal range of motion in all extremities.  Neurologic:  Normal gait without ataxia. Normal speech and language. No gross focal neurologic deficits are appreciated. Skin:  Skin is warm, dry and intact. No rash  noted. ____________________________________________   RADIOLOGY  Cervical Spine MRI (10/30/2016)  IMPRESSION:  1. C3-4 left subarticular and foraminal disc protrusion with moderate left foraminal stenosis and encroachment on exiting L4 nerve roots. 2. No acute osseous abnormality or abnormal cord signal. ____________________________________________  PROCEDURES  Toradol 30 mg IM Norflex 60 mg PO Gabapentin 300 mg PO Prednisolone 60 mg PO Zofran 4 mg ODT ____________________________________________  INITIAL IMPRESSION / ASSESSMENT AND PLAN / ED COURSE  Patient with ED evaluation of cervical radiculopathy confirmed with an MRI.  Patient's exam is overall benign.  He presents today for pain management of his symptoms.  Admittedly the patient has been poorly compliant with prescribed medications.  He did not feel previous prescriptions for steroids and neurogenic pain medicines as prescribed.  He also failed to follow-up with neurosurgery 2 days following his initial ED presentation.  At  this time discussed with the patient the fact that he has potentially done himself a disservice by selectively filling his prescriptions.  He is further advised that his actions could lead some provider to decline filling any narcotic prescriptions going forward.  He will at this time be discharged with prescriptions for Zofran to help counter any nausea related to his gabapentin; Flexeril for muscle spasms, gabapentin for neurogenic pain; prednisone for inflammation; and percocet (#20) for breakthrough pain. He is again referred to [facility]. He will keep his appointment with [facility] as well. Return precautions are reviewed.   I reviewed the patient's prescription history over the last 12 months in the multi-state controlled substances database(s) that includes [place], [place], [place], [place], [place], [place], [place], [place], [place], [place], [place],  [place], [place], and [place].  Results were notable for oxycodone prescriptions as noted above. ____________________________________________  FINAL CLINICAL IMPRESSION(S) / ED DIAGNOSES  Final diagnoses:  Cervical radiculopathy  HNP (herniated nucleus pulposus), cervical      Karmen Stabs, Timothy Ivory, PA-C 02/10/17 1837    Sharman Cheek, MD 02/10/17 2106

## 2018-01-15 ENCOUNTER — Other Ambulatory Visit: Payer: Self-pay | Admitting: Pediatrics

## 2018-01-15 ENCOUNTER — Ambulatory Visit
Admission: RE | Admit: 2018-01-15 | Discharge: 2018-01-15 | Disposition: A | Discharge status: Home / Self Care | Payer: Disability Insurance | Source: Ambulatory Visit | Attending: Pediatrics | Admitting: Pediatrics

## 2018-01-15 DIAGNOSIS — M549 Dorsalgia, unspecified: Secondary | ICD-10-CM | POA: Diagnosis not present

## 2018-01-15 DIAGNOSIS — M1711 Unilateral primary osteoarthritis, right knee: Secondary | ICD-10-CM | POA: Insufficient documentation

## 2018-05-03 ENCOUNTER — Emergency Department: Payer: Disability Insurance

## 2018-05-03 ENCOUNTER — Emergency Department
Admission: EM | Admit: 2018-05-03 | Discharge: 2018-05-03 | Disposition: A | Discharge status: Home / Self Care | Payer: Disability Insurance | Attending: Emergency Medicine | Admitting: Emergency Medicine

## 2018-05-03 ENCOUNTER — Encounter: Payer: Self-pay | Admitting: Emergency Medicine

## 2018-05-03 ENCOUNTER — Other Ambulatory Visit: Payer: Self-pay

## 2018-05-03 DIAGNOSIS — F17210 Nicotine dependence, cigarettes, uncomplicated: Secondary | ICD-10-CM | POA: Insufficient documentation

## 2018-05-03 DIAGNOSIS — I10 Essential (primary) hypertension: Secondary | ICD-10-CM | POA: Insufficient documentation

## 2018-05-03 DIAGNOSIS — M50122 Cervical disc disorder at C5-C6 level with radiculopathy: Secondary | ICD-10-CM | POA: Insufficient documentation

## 2018-05-03 DIAGNOSIS — Z96661 Presence of right artificial ankle joint: Secondary | ICD-10-CM | POA: Insufficient documentation

## 2018-05-03 MED ORDER — METHYLPREDNISOLONE 4 MG PO TBPK: ORAL_TABLET | ORAL | 0 refills | Status: DC

## 2018-05-03 MED ORDER — OXYCODONE-ACETAMINOPHEN 7.5-325 MG PO TABS: 1.0000 | ORAL_TABLET | Freq: Four times a day (QID) | ORAL | 0 refills | Status: DC | PRN

## 2018-05-03 MED ORDER — CYCLOBENZAPRINE HCL 10 MG PO TABS: 10.0000 mg | ORAL_TABLET | Freq: Three times a day (TID) | ORAL | 0 refills | Status: DC | PRN

## 2018-05-03 NOTE — Discharge Instructions (Signed)
Wear arm sling 3 to 5 days as needed.

## 2018-05-03 NOTE — ED Provider Notes (Signed)
[facility] Emergency Department Provider Note   ____________________________________________   First MD Initiated Contact with Patient 05/03/18 1147     (approximate)  I have reviewed the triage vital signs and the nursing notes.   HISTORY  Chief Complaint Shoulder Injury    HPI Timothy ButtRichard L Trant Jr. is a 43 y.o. male   patient presents with pain in right shoulder started 3 days ago.  Patient denies provocative incident.  Patient state there is a burning sensation that runs down his arm to his fingers.  Patient has a history of degenerative disc disease of the cervical spine.  Patient rates his pain discomfort 8/10.  No palliative measures for complaint.   Past Medical History:  Diagnosis Date  . Hyperlipidemia   . Hypertension     There are no active problems to display for this patient.   Past Surgical History:  Procedure Laterality Date  . ANKLE SURGERY    . JOINT REPLACEMENT Right    right ankle    Prior to Admission medications   Medication Sig Start Date End Date Taking? Authorizing Provider  cyclobenzaprine (FLEXERIL) 10 MG tablet Take 1 tablet (10 mg total) 3 (three) times daily as needed by mouth for muscle spasms. 02/10/17   Menshew, [name], PA-C  cyclobenzaprine (FLEXERIL) 10 MG tablet Take 1 tablet (10 mg total) by mouth 3 (three) times daily as needed. 05/03/18   [name], [address] Rosado K, PA-C  gabapentin (NEURONTIN) 100 MG capsule Take 3 capsules (300 mg total) by mouth 3 (three) times daily. 01/30/17 01/30/18  Jeanmarie PlantMcShane, James A, MD  gabapentin (NEURONTIN) 300 MG capsule Take 3 capsules (900 mg total) 3 (three) times daily by mouth. 02/10/17 03/12/17  Menshew, [name], PA-C  methylPREDNISolone (MEDROL DOSEPAK) 4 MG TBPK tablet Day 1: 8 mg po bid before breakfst and hs, & 4 mg po bid after lunch and dinner; day 2: 4 mg po tid and 8 mg qhs;, day 3: 4 mg po qid; day 5:  4 mg po bid;, day 6: 4 mg po q am 01/30/17   Jeanmarie PlantMcShane, James A, MD    methylPREDNISolone (MEDROL DOSEPAK) 4 MG TBPK tablet Take Tapered dose as directed 05/03/18   [name], [name], PA-C  ondansetron (ZOFRAN ODT) 4 MG disintegrating tablet Take 1 tablet (4 mg total) every 8 (eight) hours as needed by mouth. 02/10/17   Menshew, [name], PA-C  oxyCODONE-acetaminophen (PERCOCET) 7.5-325 MG tablet Take 1 tablet by mouth every 6 (six) hours as needed. 05/03/18   [name], [name], PA-C  oxyCODONE-acetaminophen (ROXICET) 5-325 MG tablet Take 1 tablet every 8 (eight) hours as needed by mouth for moderate pain or severe pain. 02/10/17   Menshew, [name], PA-C  predniSONE (STERAPRED UNI-PAK 21 TAB) 10 MG (21) TBPK tablet 6-day taper as directed. 02/10/17   Menshew, [name], PA-C  promethazine (PHENERGAN) 12.5 MG tablet Take 1 tablet (12.5 mg total) by mouth every 6 (six) hours as needed for nausea or vomiting. 11/23/16   [name], Patrick, MD  tamsulosin (FLOMAX) 0.4 MG CAPS capsule Take 1 capsule (0.4 mg total) by mouth at bedtime. 11/23/16   [name], Patrick, MD    Allergies Patient has no known allergies.  No family history on file.  Social History Social History   Tobacco Use  . Smoking status: Current Every Day Smoker    Packs/day: 0.50    Types: Cigarettes  . Smokeless tobacco: Never Used  Substance Use Topics  .  Alcohol use: No  . Drug use: Not on file    Review of Systems Constitutional: No fever/chills Eyes: No visual changes. ENT: No sore throat. Cardiovascular: Denies chest pain. Respiratory: Denies shortness of breath. Gastrointestinal: No abdominal pain.  No nausea, no vomiting.  No diarrhea.  No constipation. Genitourinary: Negative for dysuria. Musculoskeletal: Negative for back pain. Skin: Negative for rash. Neurological: Negative for headaches, focal weakness or numbness. Endocrine:  Hyperlipidemia and hypertension. ____________________________________________   PHYSICAL EXAM:  VITAL SIGNS: ED Triage Vitals  Enc  Vitals Group     BP 05/03/18 1115 (!) 164/107     Pulse Rate 05/03/18 1115 (!) 108     Resp 05/03/18 1115 18     Temp 05/03/18 1115 98.5 F (36.9 C)     Temp Source 05/03/18 1115 Oral     SpO2 05/03/18 1115 98 %     Weight 05/03/18 1117 230 lb (104.3 kg)     Height 05/03/18 1117 5\' 11"  (1.803 m)     Head Circumference --      Peak Flow --      Pain Score 05/03/18 1116 8     Pain Loc --      Pain Edu? --      Excl. in GC? --    Constitutional: Alert and oriented. Well appearing and in no acute distress. Neck:   No cervical spine tenderness to palpation. Cardiovascular: Normal rate, regular rhythm. Grossly normal heart sounds.  Good peripheral circulation. Respiratory: Normal respiratory effort.  No retractions. Lungs CTAB. Musculoskeletal: No obvious deformity to the right shoulder.  Patient has full range of motion.  Patient strength against resistance 3/5 compared to the left upper extremity.   Neurologic:  Normal speech and language. No gross focal neurologic deficits are appreciated. No gait instability. Skin:  Skin is warm, dry and intact. No rash noted. Psychiatric: Mood and affect are normal. Speech and behavior are normal.  ____________________________________________   LABS (all labs ordered are listed, but only abnormal results are displayed)  Labs Reviewed - No data to display ____________________________________________  EKG   ____________________________________________  RADIOLOGY  ED MD interpretation:    Official radiology report(s): Dg Cervical Spine 2-3 Views  Result Date: 05/03/2018 CLINICAL DATA:  Right shoulder pain and arm weakness for 2-3 days, no known injury, initial encounter EXAM: CERVICAL SPINE - 3 VIEW COMPARISON:  01/30/2017 FINDINGS: Seven cervical segments are well visualized. Vertebral body height is well maintained. No acute fracture or acute facet abnormality is noted. The odontoid is within normal limits. No soft tissue abnormality is  noted. Multiple dental caries are seen. IMPRESSION: No acute abnormality noted. Electronically Signed   By: Alcide Clever M.D.   On: 05/03/2018 12:36   Dg Shoulder Right  Result Date: 05/03/2018 CLINICAL DATA:  Generalized right shoulder pain EXAM: RIGHT SHOULDER - 2+ VIEW COMPARISON:  None. FINDINGS: There is no evidence of fracture or dislocation. There is no evidence of arthropathy or other focal bone abnormality. Soft tissues are unremarkable. IMPRESSION: Negative. Electronically Signed   By: Marnee Spring M.D.   On: 05/03/2018 11:56    ____________________________________________   PROCEDURES  Procedure(s) performed:   Procedures  Critical Care performed: No  ____________________________________________   INITIAL IMPRESSION / ASSESSMENT AND PLAN / ED COURSE  As part of my medical decision making, I reviewed the following data within the electronic MEDICAL RECORD NUMBER     Cervical radiculopathy right upper extremity secondary to degenerative disc disease of the cervical  spine.  Discussed x-ray findings with patient.  Patient given discharge care instruction.  Patient placed in arm sling and advised to follow orthopedic for definitive evaluation and treatment.      ____________________________________________   FINAL CLINICAL IMPRESSION(S) / ED DIAGNOSES  Final diagnoses:  Cervical disc disorder at C5-C6 level with radiculopathy     ED Discharge Orders         Ordered    methylPREDNISolone (MEDROL DOSEPAK) 4 MG TBPK tablet     05/03/18 1247    oxyCODONE-acetaminophen (PERCOCET) 7.5-325 MG tablet  Every 6 hours PRN     05/03/18 1247    cyclobenzaprine (FLEXERIL) 10 MG tablet  3 times daily PRN     05/03/18 1247           Note:  This document was prepared using Dragon voice recognition software and may include unintentional dictation errors.    Joni Reining, PA-C 05/03/18 1255    Sharyn Creamer, MD 05/03/18 2132

## 2018-05-03 NOTE — ED Triage Notes (Signed)
PT arrives with complaints of pain in his right shoulder that started "a few days ago." Pt denies known injury. Pt states the pain is worse with movement and palpation. Pt reports the pain has moments of burning that run down pt's. Pt in NAD. No obvious deformity

## 2019-10-08 ENCOUNTER — Emergency Department
Admission: EM | Admit: 2019-10-08 | Discharge: 2019-10-08 | Disposition: A | Discharge status: Home / Self Care | Payer: Medicare Other | Attending: Emergency Medicine | Admitting: Emergency Medicine

## 2019-10-08 ENCOUNTER — Other Ambulatory Visit: Payer: Self-pay

## 2019-10-08 ENCOUNTER — Encounter: Payer: Self-pay | Admitting: Emergency Medicine

## 2019-10-08 ENCOUNTER — Emergency Department: Payer: Medicare Other

## 2019-10-08 DIAGNOSIS — M545 Low back pain: Secondary | ICD-10-CM | POA: Diagnosis not present

## 2019-10-08 DIAGNOSIS — I10 Essential (primary) hypertension: Secondary | ICD-10-CM | POA: Insufficient documentation

## 2019-10-08 DIAGNOSIS — Z96661 Presence of right artificial ankle joint: Secondary | ICD-10-CM | POA: Diagnosis not present

## 2019-10-08 DIAGNOSIS — M5442 Lumbago with sciatica, left side: Secondary | ICD-10-CM

## 2019-10-08 DIAGNOSIS — Z79899 Other long term (current) drug therapy: Secondary | ICD-10-CM | POA: Diagnosis not present

## 2019-10-08 MED ORDER — SERTRALINE HCL 25 MG PO TABS: 25.0000 mg | ORAL_TABLET | Freq: Every day | ORAL | 2 refills | Status: AC

## 2019-10-08 MED ORDER — GABAPENTIN 100 MG PO CAPS: 300.0000 mg | ORAL_CAPSULE | Freq: Three times a day (TID) | ORAL | 0 refills | Status: AC

## 2019-10-08 MED ORDER — LISINOPRIL 20 MG PO TABS: 20.0000 mg | ORAL_TABLET | Freq: Every day | ORAL | 11 refills | Status: AC

## 2019-10-08 NOTE — ED Notes (Signed)
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

## 2019-10-08 NOTE — ED Provider Notes (Signed)
  ER Provider Note       Time seen: 4:19 PM    I have reviewed the vital signs and the nursing notes.  HISTORY   Chief Complaint Back Pain and Hypertension    HPI Timothy Mcintyre. is a 44 y.o. male with a history of hyperlipidemia, hypertension who presents today for hypertension and ongoing back pain associated with degenerative disc disease.  Patient reports picking up the front end of the lawn more irritating his back.  He reports being out of pain medicine and blood pressure medicine.  Discomfort is 8 out of 10.  Past Medical History:  Diagnosis Date  . Hyperlipidemia   . Hypertension     Past Surgical History:  Procedure Laterality Date  . ANKLE SURGERY    . JOINT REPLACEMENT Right    right ankle    Allergies Patient has no known allergies.  Review of Systems Constitutional: Negative for fever. Cardiovascular: Negative for chest pain. Respiratory: Negative for shortness of breath. Gastrointestinal: Negative for abdominal pain, vomiting and diarrhea. Musculoskeletal: Positive for back pain Skin: Negative for rash. Neurological: Negative for headaches, focal weakness or numbness.  All systems negative/normal/unremarkable except as stated in the HPI  ____________________________________________   PHYSICAL EXAM:  VITAL SIGNS: Vitals:   10/08/19 1523  BP: (!) 150/93  Pulse: 69  Resp: 20  Temp: 99.1 F (37.3 C)  SpO2: 98%    Constitutional: Alert and oriented. Well appearing and in no distress. Musculoskeletal: Nontender with normal range of motion in extremities. . Neurologic:  Normal speech and language. No gross focal neurologic deficits are appreciated.  Skin:  Skin is warm, dry and intact. No rash noted. Psychiatric: Speech and behavior are normal.  ____________________________________________   RADIOLOGY  Images were viewed by me Lumbar spine x-rays Reveal mild degenerative changes but no other acute process  DIFFERENTIAL  DIAGNOSIS  Strain, spasm, degenerative disc disease, fracture  ASSESSMENT AND PLAN  Low back pain, medication refill   Plan: The patient had presented for low back pain and medication refill.  I will refill some of his medicines but he has been advised I cannot refill his narcotics.  He is cleared for outpatient follow-up.  Daryel November MD    Note: This note was generated in part or whole with voice recognition software. Voice recognition is usually quite accurate but there are transcription errors that can and very often do occur. I apologize for any typographical errors that were not detected and corrected.     Emily Filbert, MD 10/08/19 [phone]

## 2019-10-08 NOTE — ED Triage Notes (Signed)
Pt in via POV, ambulatory with cane, complaints of hypertension x 4 days due to ongoing back pain associated with degenerative disc disease.  Pt reports picking up on the front end of a lawn mower, irritating his back.  Pt reports being out of pain medicine and blood pressure medicine.  NAD noted this time.

## 2019-10-08 NOTE — ED Notes (Signed)
Pt transported to xray via stretcher with xray tech. °

## 2019-10-08 NOTE — ED Notes (Signed)
Rainbow sent to the lab.  

## 2020-08-03 ENCOUNTER — Other Ambulatory Visit: Payer: Self-pay | Admitting: Emergency Medicine

## 2020-08-06 ENCOUNTER — Other Ambulatory Visit: Payer: Self-pay | Admitting: Emergency Medicine

## 2021-01-20 ENCOUNTER — Other Ambulatory Visit: Payer: Self-pay

## 2021-01-20 ENCOUNTER — Emergency Department
Admission: EM | Admit: 2021-01-20 | Discharge: 2021-01-20 | Disposition: A | Discharge status: Home / Self Care | Payer: Medicare Other | Attending: Emergency Medicine | Admitting: Emergency Medicine

## 2021-01-20 DIAGNOSIS — Z96661 Presence of right artificial ankle joint: Secondary | ICD-10-CM | POA: Diagnosis not present

## 2021-01-20 DIAGNOSIS — M5440 Lumbago with sciatica, unspecified side: Secondary | ICD-10-CM | POA: Insufficient documentation

## 2021-01-20 DIAGNOSIS — M545 Low back pain, unspecified: Secondary | ICD-10-CM | POA: Diagnosis present

## 2021-01-20 DIAGNOSIS — F17210 Nicotine dependence, cigarettes, uncomplicated: Secondary | ICD-10-CM | POA: Insufficient documentation

## 2021-01-20 DIAGNOSIS — I10 Essential (primary) hypertension: Secondary | ICD-10-CM | POA: Diagnosis not present

## 2021-01-20 DIAGNOSIS — M5430 Sciatica, unspecified side: Secondary | ICD-10-CM

## 2021-01-20 MED ORDER — NAPROXEN 500 MG PO TABS
500.0000 mg | ORAL_TABLET | Freq: Once | ORAL | Status: AC
  Administered 2021-01-20: 500 mg via ORAL
  Filled 2021-01-20: qty 1

## 2021-01-20 MED ORDER — NAPROXEN 500 MG PO TABS: 500.0000 mg | ORAL_TABLET | Freq: Two times a day (BID) | ORAL | 2 refills | Status: AC

## 2021-01-20 MED ORDER — DEXAMETHASONE SODIUM PHOSPHATE 10 MG/ML IJ SOLN
10.0000 mg | Freq: Once | INTRAMUSCULAR | Status: AC
  Administered 2021-01-20: 10 mg via INTRAMUSCULAR
  Filled 2021-01-20: qty 1

## 2021-01-20 MED ORDER — OXYCODONE-ACETAMINOPHEN 5-325 MG PO TABS
1.0000 | ORAL_TABLET | Freq: Once | ORAL | Status: AC
  Administered 2021-01-20: 1 via ORAL
  Filled 2021-01-20: qty 1

## 2021-01-20 NOTE — ED Notes (Signed)
See triage note   presents with back pain  states he has had some issues and has been seen by PCP for same  today the pain became worse  has a hx of degenerative disc disease

## 2021-01-20 NOTE — ED Provider Notes (Signed)
[facility] Emergency Department Provider Note   ____________________________________________    I have reviewed the triage vital signs and the nursing notes.   HISTORY  Chief Complaint Leg Pain     HPI Timothy Mcintyre. is a 45 y.o. male with a long history of low back pain presents today with complaints of low back pain radiating into his legs bilaterally.  He reports this is been ongoing for some time.  Seems to have worsened today because he thinks that he "overdid it ".  Denies urinary loss of continence, no fevers or chills.  Did not want to come but his brother made him come.  No numbness or weakness in the lower extremities.    Past Medical History:  Diagnosis Date   Hyperlipidemia    Hypertension     There are no problems to display for this patient.   Past Surgical History:  Procedure Laterality Date   ANKLE SURGERY     JOINT REPLACEMENT Right    right ankle    Prior to Admission medications   Medication Sig Start Date End Date Taking? Authorizing Provider  naproxen (NAPROSYN) 500 MG tablet Take 1 tablet (500 mg total) by mouth 2 (two) times daily with a meal. 01/20/21  Yes Jene Every, MD  gabapentin (NEURONTIN) 100 MG capsule Take 3 capsules (300 mg total) by mouth 3 (three) times daily. 10/08/19 10/07/20  Emily Filbert, MD  gabapentin (NEURONTIN) 300 MG capsule Take 3 capsules (900 mg total) 3 (three) times daily by mouth. 02/10/17 03/12/17  Menshew, Charlesetta Ivory, PA-C  lisinopril (ZESTRIL) 20 MG tablet Take 1 tablet (20 mg total) by mouth daily. 10/08/19 10/07/20  Emily Filbert, MD  sertraline (ZOLOFT) 25 MG tablet Take 1 tablet (25 mg total) by mouth daily. 10/08/19 10/07/20  Emily Filbert, MD  tamsulosin (FLOMAX) 0.4 MG CAPS capsule Take 1 capsule (0.4 mg total) by mouth at bedtime. 11/23/16   Willy Eddy, MD     Allergies Patient has no known allergies.  No family history on file.  Social  History Social History   Tobacco Use   Smoking status: Every Day    Packs/day: 0.50    Types: Cigarettes   Smokeless tobacco: Never  Vaping Use   Vaping Use: Never used  Substance Use Topics   Alcohol use: No    Review of Systems  Constitutional: No fever/chills Eyes: No visual changes.  ENT: No sore throat. Cardiovascular: Denies chest pain. Respiratory: Denies shortness of breath. Gastrointestinal: No abdominal pain.  No nausea, no vomiting.   Genitourinary: Negative for dysuria. Musculoskeletal: As above Skin: Negative for rash. Neurological: Negative for headaches or weakness   ____________________________________________   PHYSICAL EXAM:  VITAL SIGNS: ED Triage Vitals  Enc Vitals Group     BP 01/20/21 1632 (!) 145/90     Pulse Rate 01/20/21 1632 (!) 104     Resp 01/20/21 1632 18     Temp 01/20/21 1632 98.6 F (37 C)     Temp Source 01/20/21 1632 Oral     SpO2 01/20/21 1632 95 %     Weight --      Height --      Head Circumference --      Peak Flow --      Pain Score 01/20/21 1633 8     Pain Loc --      Pain Edu? --      Excl. in GC? --  Constitutional: Alert and oriented. No acute distress. Pleasant and interactive  Nose: No congestion/rhinnorhea. Mouth/Throat: Mucous membranes are moist.    Cardiovascular: Normal rate, regular rhythm.   Good peripheral circulation. Respiratory: Normal respiratory effort.  No retractions. Gastrointestinal: Soft and nontender. No distention.    Musculoskeletal: Normal strength in the lower extremities, ambulating well, no vertebral tenderness palpation Neurologic:  Normal speech and language. No gross focal neurologic deficits are appreciated.  Skin:  Skin is warm, dry and intact. No rash noted. Psychiatric: Mood and affect are normal. Speech and behavior are normal.  ____________________________________________   LABS (all labs ordered are listed, but only abnormal results are displayed)  Labs Reviewed -  No data to display ____________________________________________  EKG   ____________________________________________  RADIOLOGY   ____________________________________________   PROCEDURES  Procedure(s) performed: No  Procedures   Critical Care performed: No ____________________________________________   INITIAL IMPRESSION / ASSESSMENT AND PLAN / ED COURSE  Pertinent labs & imaging results that were available during my care of the patient were reviewed by me and considered in my medical decision making (see chart for details).   Patient presents with acute on chronic back pain.  Overall well-appearing, ambulating well, no neurodeficits.  Afebrile, will treat with IM Decadron, p.o. Naprosyn and Percocet.  Appropriate for outpatient follow-up with neurosurgery given his chronic back pain    ____________________________________________   FINAL CLINICAL IMPRESSION(S) / ED DIAGNOSES  Final diagnoses:  Sciatica, unspecified laterality        Note:  This document was prepared using Dragon voice recognition software and may include unintentional dictation errors.    Jene Every, MD 01/20/21 Zollie Pee

## 2021-01-20 NOTE — ED Triage Notes (Signed)
Pt states that he has a hx of degenerative disc disease, states that he saw his dr on the 6th, states that in the past 2 days the pain has gotten much severe, pt states that he feels like he has  wire in his right leg that is being pulled upward and states that he has pain if he moves his neck up and down Pt states that he had an episode of incontinence with his stool yesterday, states that his stool was loose right leg feels numb

## 2021-10-21 ENCOUNTER — Emergency Department
Admission: EM | Admit: 2021-10-21 | Discharge: 2021-10-21 | Disposition: A | Discharge status: Home / Self Care | Payer: Medicare Other | Attending: Emergency Medicine | Admitting: Emergency Medicine

## 2021-10-21 ENCOUNTER — Other Ambulatory Visit: Payer: Self-pay

## 2021-10-21 ENCOUNTER — Emergency Department: Payer: Medicare Other

## 2021-10-21 ENCOUNTER — Encounter: Payer: Self-pay | Admitting: Emergency Medicine

## 2021-10-21 DIAGNOSIS — N390 Urinary tract infection, site not specified: Secondary | ICD-10-CM | POA: Diagnosis not present

## 2021-10-21 DIAGNOSIS — R110 Nausea: Secondary | ICD-10-CM | POA: Insufficient documentation

## 2021-10-21 DIAGNOSIS — R109 Unspecified abdominal pain: Secondary | ICD-10-CM | POA: Diagnosis present

## 2021-10-21 DIAGNOSIS — I10 Essential (primary) hypertension: Secondary | ICD-10-CM | POA: Diagnosis not present

## 2021-10-21 DIAGNOSIS — R319 Hematuria, unspecified: Secondary | ICD-10-CM

## 2021-10-21 HISTORY — DX: Calculus of kidney: N20.0

## 2021-10-21 LAB — BASIC METABOLIC PANEL
Anion gap: 9 (ref 5–15)
BUN: 7 mg/dL (ref 6–20)
CO2: 29 mmol/L (ref 22–32)
Calcium: 9.4 mg/dL (ref 8.9–10.3)
Chloride: 103 mmol/L (ref 98–111)
Creatinine, Ser: 0.77 mg/dL (ref 0.61–1.24)
GFR, Estimated: >60 mL/min (ref >60)
Glucose, Bld: 81 mg/dL (ref 70–99)
Potassium: 4 mmol/L (ref 3.5–5.1)
Sodium: 141 mmol/L (ref 135–145)

## 2021-10-21 LAB — CBC
HCT: 43.5 % (ref 39.0–52.0)
Hemoglobin: 14 g/dL (ref 13.0–17.0)
MCH: 29.1 pg (ref 26.0–34.0)
MCHC: 32.2 g/dL (ref 30.0–36.0)
MCV: 90.4 fL (ref 80.0–100.0)
Platelets: 321 10*3/uL (ref 150–400)
RBC: 4.81 MIL/uL (ref 4.22–5.81)
RDW: 13.2 % (ref 11.5–15.5)
WBC: 13.6 10*3/uL — ABNORMAL HIGH (ref 4.0–10.5)
nRBC: 0 % (ref 0.0–0.2)

## 2021-10-21 LAB — URINALYSIS, ROUTINE W REFLEX MICROSCOPIC
Bilirubin Urine: NEGATIVE
Glucose, UA: NEGATIVE mg/dL
Hgb urine dipstick: NEGATIVE
Ketones, ur: NEGATIVE mg/dL
Nitrite: NEGATIVE
Protein, ur: NEGATIVE mg/dL
Specific Gravity, Urine: 1.003 — ABNORMAL LOW (ref 1.005–1.030)
Squamous Epithelial / HPF: NONE SEEN (ref 0–5)
pH: 7 (ref 5.0–8.0)

## 2021-10-21 MED ORDER — CEPHALEXIN 500 MG PO CAPS
500.0000 mg | ORAL_CAPSULE | Freq: Four times a day (QID) | ORAL | 0 refills | Status: AC
Start: 2021-10-21 — End: 2021-10-31

## 2021-10-21 MED ORDER — KETOROLAC TROMETHAMINE 30 MG/ML IJ SOLN
30.0000 mg | Freq: Once | INTRAMUSCULAR | Status: AC
  Administered 2021-10-21: 30 mg via INTRAMUSCULAR
  Filled 2021-10-21: qty 1

## 2021-10-21 MED ORDER — CEPHALEXIN 500 MG PO CAPS
500.0000 mg | ORAL_CAPSULE | Freq: Once | ORAL | Status: AC
  Administered 2021-10-21: 500 mg via ORAL
  Filled 2021-10-21: qty 1

## 2021-10-21 NOTE — ED Provider Triage Note (Signed)
Emergency Medicine Provider Triage Evaluation Note  Timothy Mcintyre. , a 46 y.o. male  was evaluated in triage.  Pt complains of Pt complains of left flank/left abdominal pain x4 days.  Patient does have a history of kidney stones, feels similar to the last.  He has had some hematuria.  No dysuria.  No emesis, diarrhea, constipation.  Review of Systems  Positive: Patient with left flank/left abdominal pain, hematuria Negative: Nausea, vomiting, diarrhea, constipation  Physical Exam   Gen:   Awake, no distress   Resp:  Normal effort  MSK:   Moves extremities without difficulty  Other:  Sounds present.  Tender along the left lower quadrant with left-sided CVA tenderness  Medical Decision Making  Medically screening exam initiated at 4:52 PM.  Appropriate orders placed.  Timothy Mcintyre was informed that the remainder of the evaluation will be completed by another provider, this initial triage assessment does not replace that evaluation, and the importance of remaining in the ED until their evaluation is complete.  Patient presents with left flank/left abdominal pain.  Has a history of kidney stones.  Will order labs, urinalysis, CT scan.   Racheal Patches, PA-C 10/21/21 1655

## 2021-10-21 NOTE — ED Triage Notes (Signed)
Patient c/o bilateral flank pain x 4 days worsening today. Hematuria over the past couple days.

## 2021-10-21 NOTE — ED Notes (Signed)
See triage note. Pt ambulatory to room. Pt reports bilateral flank pain and hematuria x4 days.

## 2021-10-21 NOTE — ED Provider Notes (Signed)
[facility] Provider Note    Event Date/Time   First MD Initiated Contact with Patient 10/21/21 1728     (approximate)   History   Chief Complaint Flank Pain   HPI  Timothy Mcintyre. is a 46 y.o. male with past medical history of hypertension, hyperlipidemia, kidney stones, and chronic back pain who presents to the ED complaining of flank pain.  Patient reports that over the past 3 days he has been dealing with gradually worsening pain in both flanks, left greater than right.  He describes the pain as sharp and not exacerbated or alleviated by anything in particular.  He has been feeling nauseous but has not vomited and denies any diarrhea.  He does state that he has been noticing blood in his urine recently along with some dysuria.  He denies any fevers, has not had any cough, chest pain, or shortness of breath.  He takes oxycodone chronically for his back, states this has not been alleviating his pain.     Physical Exam   Triage Vital Signs: ED Triage Vitals [10/21/21 1649]  Enc Vitals Group     BP (!) 177/101     Pulse Rate 68     Resp 18     Temp 98.9 F (37.2 C)     Temp Source Oral     SpO2 100 %     Weight 230 lb (104.3 kg)     Height 5\' 10"  (1.778 m)     Head Circumference      Peak Flow      Pain Score 8     Pain Loc      Pain Edu?      Excl. in GC?     Most recent vital signs: Vitals:   10/21/21 1649  BP: (!) 177/101  Pulse: 68  Resp: 18  Temp: 98.9 F (37.2 C)  SpO2: 100%    Constitutional: Alert and oriented. Eyes: Conjunctivae are normal. Head: Atraumatic. Nose: No congestion/rhinnorhea. Mouth/Throat: Mucous membranes are moist.  Cardiovascular: Normal rate, regular rhythm. Grossly normal heart sounds.  2+ radial and DP pulses bilaterally. Respiratory: Normal respiratory effort.  No retractions. Lungs CTAB. Gastrointestinal: Soft and nontender.  Left CVA tenderness to palpation noted.  No  distention. Musculoskeletal: No lower extremity tenderness nor edema.  Neurologic:  Normal speech and language. No gross focal neurologic deficits are appreciated.    ED Results / Procedures / Treatments   Labs (all labs ordered are listed, but only abnormal results are displayed) Labs Reviewed  URINALYSIS, ROUTINE W REFLEX MICROSCOPIC - Abnormal; Notable for the following components:      Result Value   Color, Urine STRAW (*)    APPearance CLEAR (*)    Specific Gravity, Urine 1.003 (*)    Leukocytes,Ua TRACE (*)    Bacteria, UA RARE (*)    All other components within normal limits  CBC - Abnormal; Notable for the following components:   WBC 13.6 (*)    All other components within normal limits  URINE CULTURE  BASIC METABOLIC PANEL   RADIOLOGY CT renal protocol reviewed and interpreted by me with bilateral intrarenal stones with no obstructing ureteral stone.  PROCEDURES:  Critical Care performed: No  Procedures   MEDICATIONS ORDERED IN ED: Medications  ketorolac (TORADOL) 30 MG/ML injection 30 mg (30 mg Intramuscular Given 10/21/21 1815)  cephALEXin (KEFLEX) capsule 500 mg (500 mg Oral Given 10/21/21 1815)     IMPRESSION / MDM /  ASSESSMENT AND PLAN / ED COURSE  I reviewed the triage vital signs and the nursing notes.                              46 y.o. male with past medical history of hypertension, hyperlipidemia, kidney stones, and chronic back pain who presents to the ED complaining of 3 days of gradually worsening pain in his bilateral flanks, left greater than right, with hematuria and dysuria.  Patient's presentation is most consistent with acute presentation with potential threat to life or bodily function.  Differential diagnosis includes, but is not limited to, kidney stone, pyelonephritis, UTI, AKI, electrolyte abnormality, malignancy.  Patient well-appearing and in no acute distress, vital signs remarkable for hypertension but otherwise reassuring.   Pain is reproducible with palpation of costovertebral area on the left, CT scan shows no evidence of obstructing stone.  Doubt dissection given he is neurovascular intact to all 4 extremities with reproducible pain at his costovertebral area.  CT does show thickening of the bladder and patient endorses urinary symptoms with bacteria and trace leukocytes seen on urinalysis.  We will send urine for culture and treat with Keflex, no signs of systemic illness at this time.  Plan to treat pain with IM Toradol and have patient follow-up with his PCP.  He was counseled to return to the ED for new or worsening symptoms and counseled to follow-up with urology if hematuria persist following treatment of infection.  Patient agrees with plan.      FINAL CLINICAL IMPRESSION(S) / ED DIAGNOSES   Final diagnoses:  Left flank pain  Urinary tract infection with hematuria, site unspecified     Rx / DC Orders   ED Discharge Orders          Ordered    cephALEXin (KEFLEX) 500 MG capsule  4 times daily        10/21/21 1824             Note:  This document was prepared using Dragon voice recognition software and may include unintentional dictation errors.   [name], MD 10/21/21 [phone]

## 2021-10-22 LAB — URINE CULTURE: Culture: <10000 — AB

## 2021-11-15 IMAGING — CR DG LUMBAR SPINE 2-3V
1 series · 3 of 3 positions shown · non-contrast
Comparison: January 30, 2017

CLINICAL DATA: Lower back pain.

EXAM:
LUMBAR SPINE - 2-3 VIEW

[Series 1: dg lumbar spine 2-3 views · 0.14mm/px · 3 of 3 slices shown]
[im 1/3]
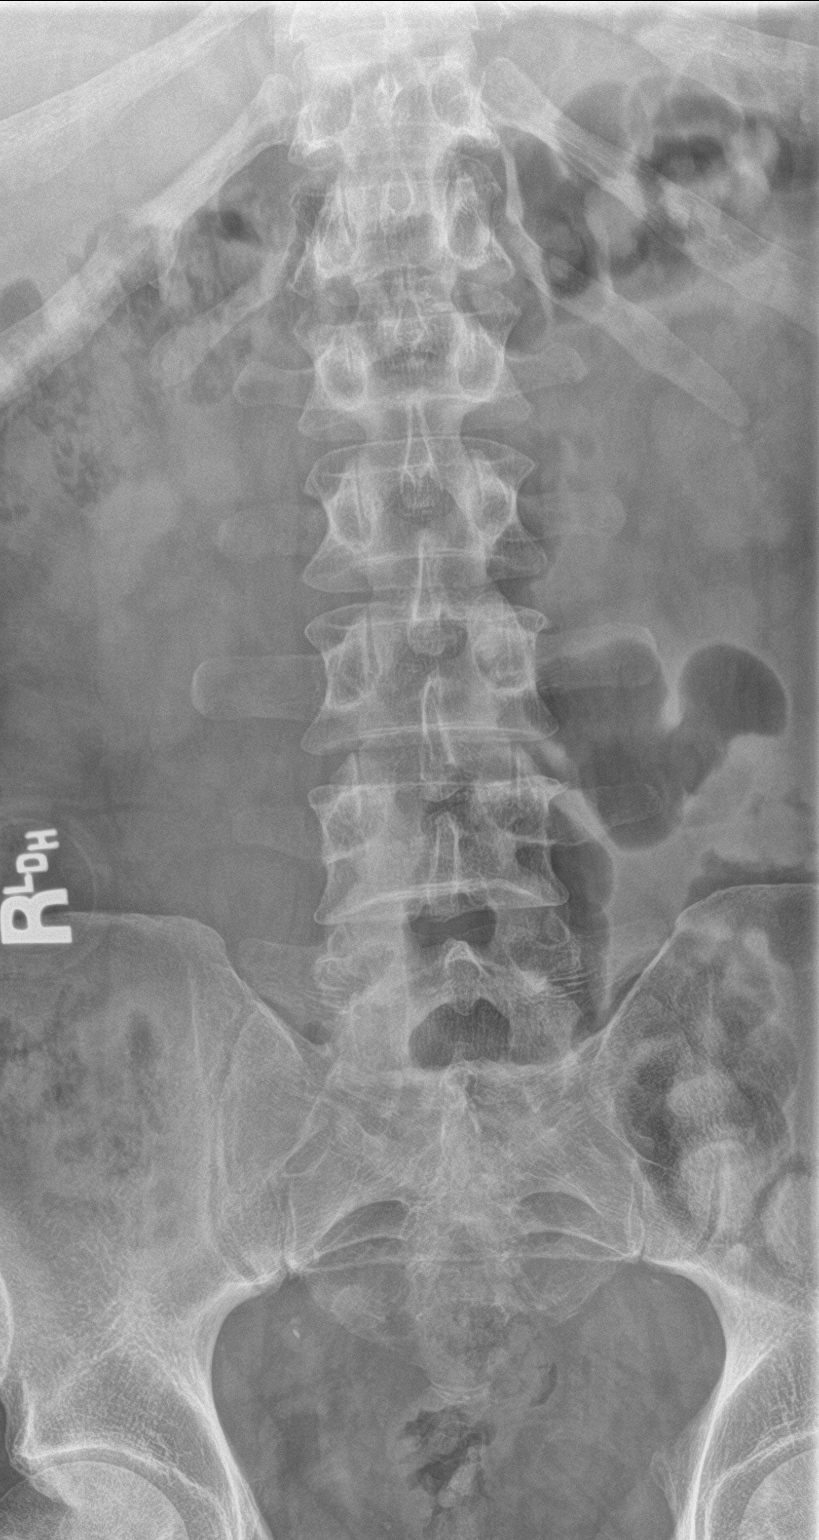
[im 2/3]
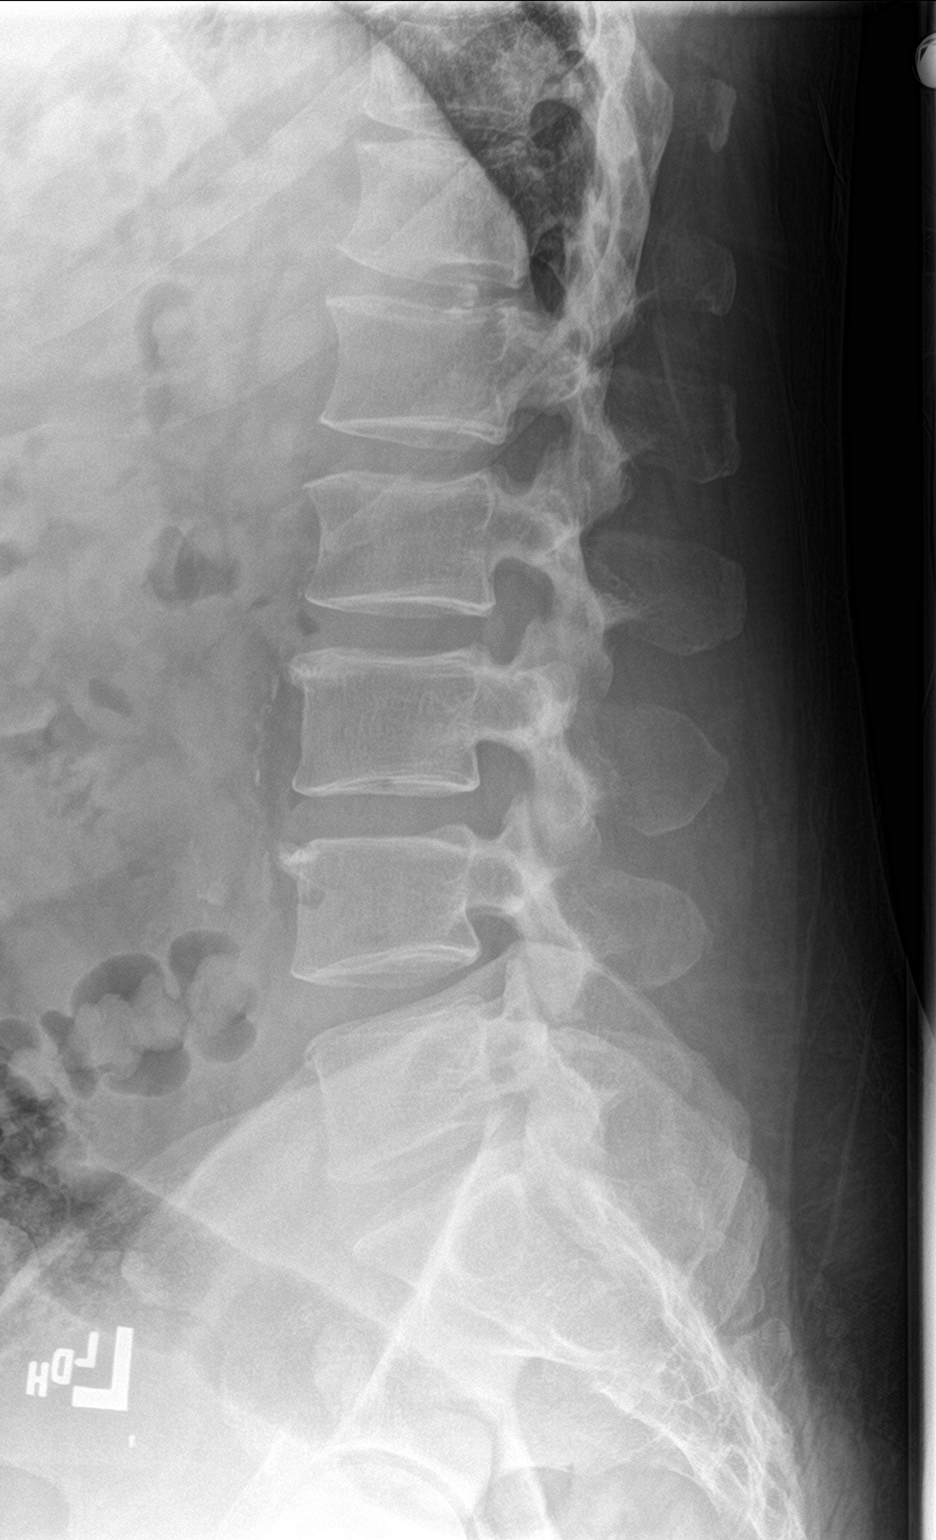
[im 3/3]
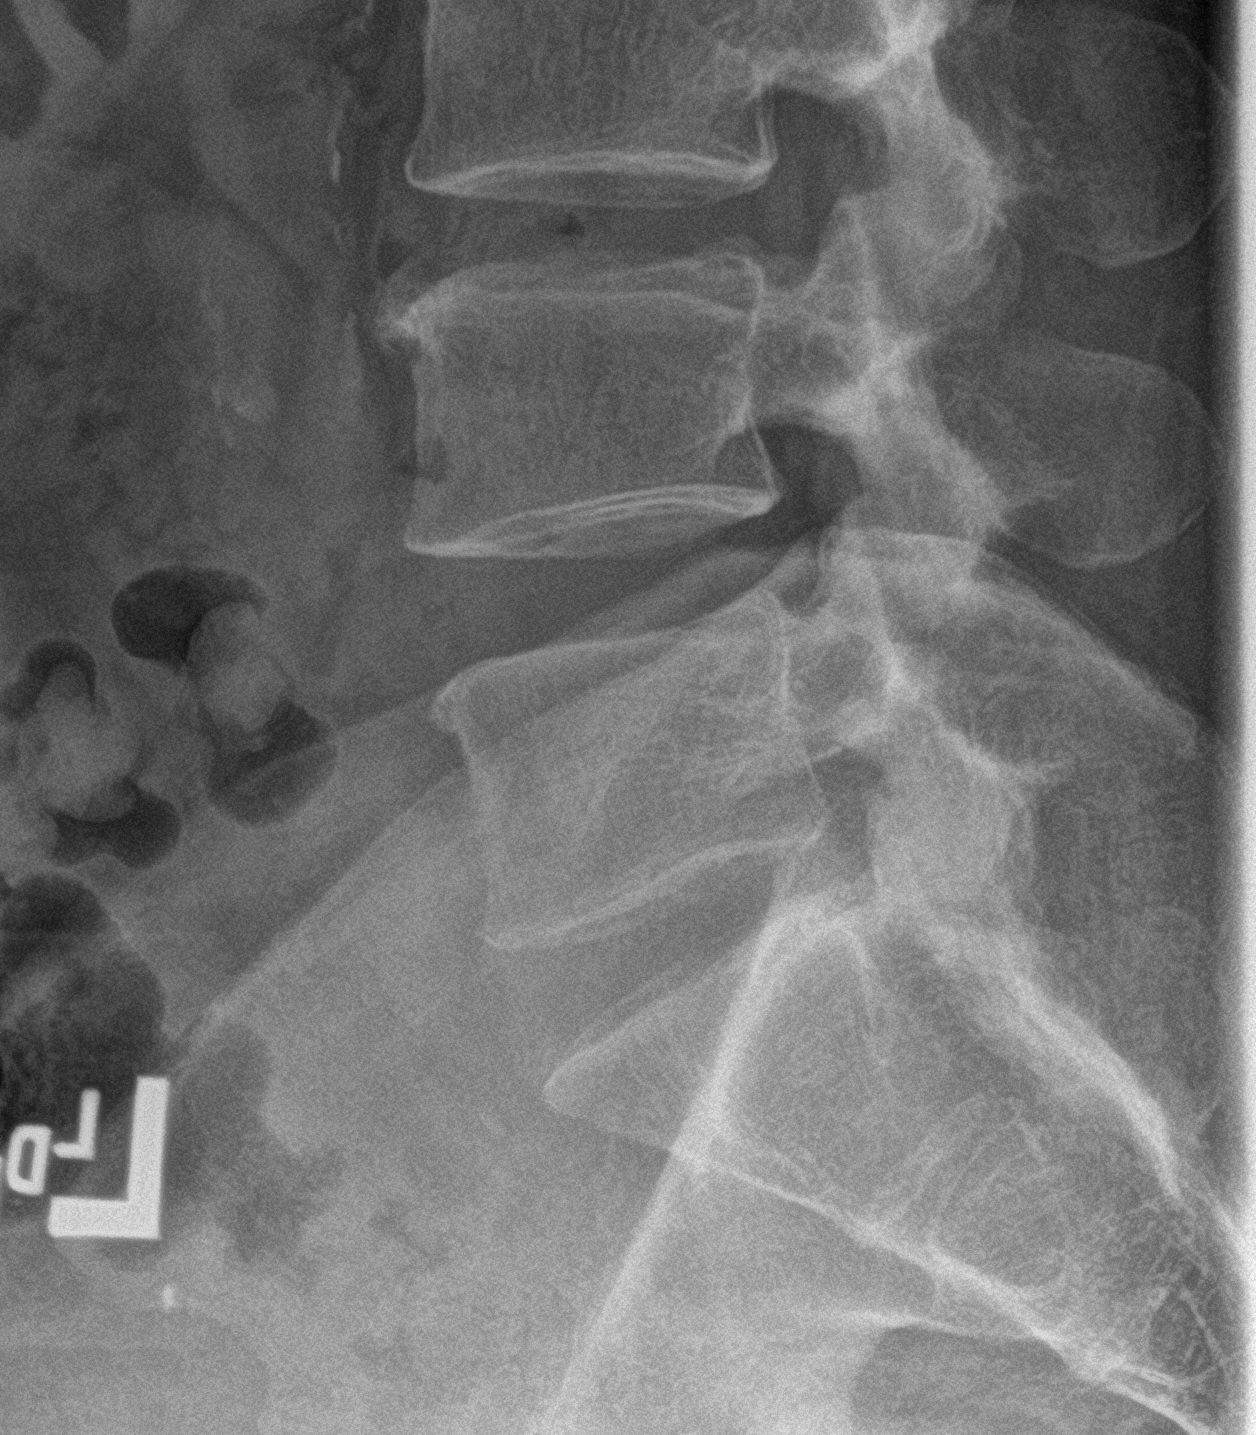

[3 of 3 positions shown; findings below may reference images not displayed]

FINDINGS: There is no evidence of lumbar spine fracture. Alignment is normal.
Mild endplate sclerosis and anterior osteophyte formation is seen at
the levels of L2-L3 and L3-L4. Intervertebral disc spaces are
maintained. There is mild calcification of the abdominal aorta.
IMPRESSION: Mild degenerative changes at L2-L3 and L3-L4.

## 2023-06-22 ENCOUNTER — Emergency Department

## 2023-06-22 ENCOUNTER — Other Ambulatory Visit: Payer: Self-pay

## 2023-06-22 ENCOUNTER — Emergency Department
Admission: EM | Admit: 2023-06-22 | Discharge: 2023-06-22 | Discharge status: Against Medical Advice | Attending: Emergency Medicine | Admitting: Emergency Medicine

## 2023-06-22 DIAGNOSIS — W293XXA Contact with powered garden and outdoor hand tools and machinery, initial encounter: Secondary | ICD-10-CM | POA: Diagnosis not present

## 2023-06-22 DIAGNOSIS — Z5321 Procedure and treatment not carried out due to patient leaving prior to being seen by health care provider: Secondary | ICD-10-CM | POA: Insufficient documentation

## 2023-06-22 DIAGNOSIS — S60939A Unspecified superficial injury of unspecified thumb, initial encounter: Secondary | ICD-10-CM | POA: Diagnosis present

## 2023-06-22 MED ORDER — OXYCODONE-ACETAMINOPHEN 5-325 MG PO TABS
1.0000 | ORAL_TABLET | Freq: Once | ORAL | Status: AC
  Administered 2023-06-22: 1 via ORAL
  Filled 2023-06-22: qty 1

## 2023-06-22 NOTE — ED Triage Notes (Signed)
 Pt here with a thumb injury. Pt was pulling up grass and got caught in a trimmer. Pt bleeding has stopped but is very painful.
